# Patient Record
Sex: Male | Born: 1950 | Race: White | Hispanic: No | Marital: Married | Smoking: Never smoker
Health system: Southern US, Community
[De-identification: ages and names within clinical notes are randomized; demographics above are authoritative.]

## PROBLEM LIST (undated history)

## (undated) DIAGNOSIS — I10 Essential (primary) hypertension: Secondary | ICD-10-CM

## (undated) DIAGNOSIS — N200 Calculus of kidney: Secondary | ICD-10-CM

## (undated) DIAGNOSIS — E119 Type 2 diabetes mellitus without complications: Secondary | ICD-10-CM

## (undated) DIAGNOSIS — M1990 Unspecified osteoarthritis, unspecified site: Secondary | ICD-10-CM

## (undated) DIAGNOSIS — K219 Gastro-esophageal reflux disease without esophagitis: Secondary | ICD-10-CM

## (undated) HISTORY — PX: COLONOSCOPY: SHX174

## (undated) HISTORY — PX: JOINT REPLACEMENT: SHX530

## (undated) HISTORY — PX: ROTATOR CUFF REPAIR: SHX139

---

## 2015-02-27 ENCOUNTER — Other Ambulatory Visit: Payer: Self-pay | Admitting: Orthopedic Surgery

## 2015-02-27 DIAGNOSIS — M533 Sacrococcygeal disorders, not elsewhere classified: Secondary | ICD-10-CM

## 2015-02-27 DIAGNOSIS — M48061 Spinal stenosis, lumbar region without neurogenic claudication: Secondary | ICD-10-CM

## 2015-03-08 ENCOUNTER — Ambulatory Visit
Admission: RE | Admit: 2015-03-08 | Discharge: 2015-03-08 | Disposition: A | Discharge status: Home / Self Care | Payer: Worker's Compensation | Source: Ambulatory Visit | Attending: Orthopedic Surgery | Admitting: Orthopedic Surgery

## 2015-03-08 DIAGNOSIS — M533 Sacrococcygeal disorders, not elsewhere classified: Secondary | ICD-10-CM

## 2015-03-08 DIAGNOSIS — M48061 Spinal stenosis, lumbar region without neurogenic claudication: Secondary | ICD-10-CM

## 2015-08-09 ENCOUNTER — Other Ambulatory Visit: Payer: Self-pay | Admitting: Orthopedic Surgery

## 2015-08-21 ENCOUNTER — Other Ambulatory Visit (HOSPITAL_COMMUNITY): Payer: Self-pay | Admitting: *Deleted

## 2015-08-21 NOTE — Pre-Procedure Instructions (Signed)
Robert Golden  08/21/2015      [id number] [place]  - [address] [place] [address] Phone: [phone] Fax: [phone]    Your procedure is scheduled on Thursday, August 29, 2015 at 7:30 AM.  Report to [facility] Entrance "A" Admitting Office at 5:30 AM.  Call this number if you have problems the morning of surgery: [phone]  Any questions prior to day of surgery, please call [phone] between 8 & 4 PM.   Remember:  Do not eat food or drink liquids after midnight Wednesday, 08/28/15.  Take these medicines the morning of surgery with A SIP OF WATER: Hydrocodone - if needed, eye drops  Stop Aspirin as of today.    How to Manage Your Diabetes Before Surgery   Why is it important to control my blood sugar before and after surgery?   Improving blood sugar levels before and after surgery helps healing and can limit problems.  A way of improving blood sugar control is eating a healthy diet by:  - Eating less sugar and carbohydrates  - Increasing activity/exercise  - Talk with your doctor about reaching your blood sugar goals  High blood sugars (greater than 180 mg/dL) can raise your risk of infections and slow down your recovery so you will need to focus on controlling your diabetes during the weeks before surgery.  Make sure that the doctor who takes care of your diabetes knows about your planned surgery including the date and location.  How do I manage my blood sugars before surgery?   Check your blood sugar at least 4 times a day, 2 days before surgery to make sure that they are not too high or low.  Check your blood sugar the morning of your surgery when you wake up and every 2 hours until you get to the Short-Stay unit.  Treat a low blood sugar (less than 70 mg/dL) with 1/2 cup of clear juice (cranberry or apple), 4 glucose tablets, OR glucose gel.  Recheck blood sugar in 15 minutes after  treatment (to make sure it is greater than 70 mg/dL).  If blood sugar is not greater than 70 mg/dL on re-check, call [phone] for further instructions.   Report your blood sugar to the Short-Stay nurse when you get to Short-Stay.  References:  [facility], 2007 "How to Manage your Diabetes Before and After Surgery".  What do I do about my diabetes medications?   Do not take oral diabetes medicines (pills) the morning of surgery.   Do not wear jewelry.  Do not wear lotions, powders, or cologne.  You may wear deodorant.  Men may shave face and neck.  Do not bring valuables to the hospital.  [facility] is not responsible for any belongings or valuables.  Contacts, dentures or bridgework may not be worn into surgery.  Leave your suitcase in the car.  After surgery it may be brought to your room.  For patients admitted to the hospital, discharge time will be determined by your treatment team.  Special instructions:  [facility] - Preparing for Surgery  Before surgery, you can play an important role.  Because skin is not sterile, your skin needs to be as free of germs as possible.  You can reduce the number of germs on you skin by washing with CHG (chlorahexidine gluconate) soap before surgery.  CHG is an antiseptic cleaner which kills  germs and bonds with the skin to continue killing germs even after washing.  Please DO NOT use if you have an allergy to CHG or antibacterial soaps.  If your skin becomes reddened/irritated stop using the CHG and inform your nurse when you arrive at Short Stay.  Do not shave (including legs and underarms) for at least 48 hours prior to the first CHG shower.  You may shave your face.  Please follow these instructions carefully:   1.  Shower with CHG Soap the night before surgery and the                                morning of Surgery.  2.  If you choose to wash your hair, wash your hair first as usual with your       normal  shampoo.  3.  After you shampoo, rinse your hair and body thoroughly to remove the                      Shampoo.  4.  Use CHG as you would any other liquid soap.  You can apply chg directly       to the skin and wash gently with scrungie or a clean washcloth.  5.  Apply the CHG Soap to your body ONLY FROM THE NECK DOWN.        Do not use on open wounds or open sores.  Avoid contact with your eyes, ears, mouth and genitals (private parts).  Wash genitals (private parts) with your normal soap.  6.  Wash thoroughly, paying special attention to the area where your surgery        will be performed.  7.  Thoroughly rinse your body with warm water from the neck down.  8.  DO NOT shower/wash with your normal soap after using and rinsing off       the CHG Soap.  9.  Pat yourself dry with a clean towel.            10.  Wear clean pajamas.            11.  Place clean sheets on your bed the night of your first shower and do not        sleep with pets.  Day of Surgery  Do not apply any lotions the morning of surgery.  Please wear clean clothes to the hospital.   Please read over the following fact sheets that you were given. Pain Booklet, Coughing and Deep Breathing, Blood Transfusion Information, MRSA Information and Surgical Site Infection Prevention

## 2015-08-22 ENCOUNTER — Encounter (HOSPITAL_COMMUNITY)
Admission: RE | Admit: 2015-08-22 | Discharge: 2015-08-22 | Disposition: A | Discharge status: Home / Self Care | Payer: Worker's Compensation | Source: Ambulatory Visit | Attending: Orthopedic Surgery | Admitting: Orthopedic Surgery

## 2015-08-22 ENCOUNTER — Encounter (HOSPITAL_COMMUNITY): Payer: Self-pay

## 2015-08-22 DIAGNOSIS — Z01812 Encounter for preprocedural laboratory examination: Secondary | ICD-10-CM | POA: Insufficient documentation

## 2015-08-22 DIAGNOSIS — M79604 Pain in right leg: Secondary | ICD-10-CM | POA: Diagnosis not present

## 2015-08-22 DIAGNOSIS — Z01818 Encounter for other preprocedural examination: Secondary | ICD-10-CM | POA: Insufficient documentation

## 2015-08-22 HISTORY — DX: Gastro-esophageal reflux disease without esophagitis: K21.9

## 2015-08-22 HISTORY — DX: Type 2 diabetes mellitus without complications: E11.9

## 2015-08-22 HISTORY — DX: Calculus of kidney: N20.0

## 2015-08-22 HISTORY — DX: Essential (primary) hypertension: I10

## 2015-08-22 HISTORY — DX: Unspecified osteoarthritis, unspecified site: M19.90

## 2015-08-22 LAB — COMPREHENSIVE METABOLIC PANEL
ALT: 25 U/L (ref 17–63)
AST: 34 U/L (ref 15–41)
Albumin: 3.7 g/dL (ref 3.5–5.0)
Alkaline Phosphatase: 38 U/L (ref 38–126)
Anion gap: 12 (ref 5–15)
BILIRUBIN TOTAL: 0.5 mg/dL (ref 0.3–1.2)
BUN: 16 mg/dL (ref 6–20)
CALCIUM: 9.8 mg/dL (ref 8.9–10.3)
CO2: 24 mmol/L (ref 22–32)
CREATININE: 1.04 mg/dL (ref 0.61–1.24)
Chloride: 105 mmol/L (ref 101–111)
GFR calc Af Amer: >60 mL/min (ref >60)
Glucose, Bld: 140 mg/dL — ABNORMAL HIGH (ref 65–99)
Potassium: 4 mmol/L (ref 3.5–5.1)
Sodium: 141 mmol/L (ref 135–145)
TOTAL PROTEIN: 6.8 g/dL (ref 6.5–8.1)

## 2015-08-22 LAB — URINE MICROSCOPIC-ADD ON
Bacteria, UA: NONE SEEN
RBC / HPF: NONE SEEN RBC/hpf (ref 0–5)
WBC UA: NONE SEEN WBC/hpf (ref 0–5)

## 2015-08-22 LAB — CBC WITH DIFFERENTIAL/PLATELET
BASOS ABS: 0 10*3/uL (ref 0.0–0.1)
Basophils Relative: 0 %
Eosinophils Absolute: 0.2 10*3/uL (ref 0.0–0.7)
Eosinophils Relative: 4 %
HEMATOCRIT: 35.2 % — AB (ref 39.0–52.0)
Hemoglobin: 11.1 g/dL — ABNORMAL LOW (ref 13.0–17.0)
LYMPHS ABS: 1.7 10*3/uL (ref 0.7–4.0)
LYMPHS PCT: 36 %
MCH: 23.4 pg — AB (ref 26.0–34.0)
MCHC: 31.5 g/dL (ref 30.0–36.0)
MCV: 74.1 fL — AB (ref 78.0–100.0)
MONO ABS: 0.5 10*3/uL (ref 0.1–1.0)
Monocytes Relative: 11 %
NEUTROS ABS: 2.3 10*3/uL (ref 1.7–7.7)
Neutrophils Relative %: 49 %
Platelets: 235 10*3/uL (ref 150–400)
RBC: 4.75 MIL/uL (ref 4.22–5.81)
RDW: 16.2 % — ABNORMAL HIGH (ref 11.5–15.5)
WBC: 4.7 10*3/uL (ref 4.0–10.5)

## 2015-08-22 LAB — TYPE AND SCREEN
ABO/RH(D): A POS
Antibody Screen: NEGATIVE

## 2015-08-22 LAB — URINALYSIS, ROUTINE W REFLEX MICROSCOPIC
BILIRUBIN URINE: NEGATIVE
HGB URINE DIPSTICK: NEGATIVE
KETONES UR: NEGATIVE mg/dL
Leukocytes, UA: NEGATIVE
Nitrite: NEGATIVE
PH: 6 (ref 5.0–8.0)
Protein, ur: NEGATIVE mg/dL
Specific Gravity, Urine: 1.036 — ABNORMAL HIGH (ref 1.005–1.030)

## 2015-08-22 LAB — ABO/RH: ABO/RH(D): A POS

## 2015-08-22 LAB — PROTIME-INR
INR: 1.16 (ref 0.00–1.49)
PROTHROMBIN TIME: 15 s (ref 11.6–15.2)

## 2015-08-22 LAB — SURGICAL PCR SCREEN
MRSA, PCR: NEGATIVE
Staphylococcus aureus: POSITIVE — AB

## 2015-08-22 LAB — APTT: APTT: 29 s (ref 24–37)

## 2015-08-22 LAB — GLUCOSE, CAPILLARY: Glucose-Capillary: 142 mg/dL — ABNORMAL HIGH (ref 65–99)

## 2015-08-22 NOTE — Progress Notes (Signed)
PCP is Dr. Gracy Bruinsadiotchenko Denies ever seeing a cardiologist. Denies ever having a card cath, Stress test, or echo. Reports his fasting cbg's run 120-140's

## 2015-08-22 NOTE — Progress Notes (Signed)
[facility] [place]. Mr Robert Golden was called and informed to pick up the script.

## 2015-08-23 LAB — HEMOGLOBIN A1C
HEMOGLOBIN A1C: 7.6 % — AB (ref 4.8–5.6)
Mean Plasma Glucose: 171 mg/dL

## 2015-08-27 NOTE — H&P (Signed)
PREOPERATIVE H&P  Chief Complaint: R leg pain  HPI: Robert Golden E Nault is a 65 y.o. male who presents with ongoing pain in the right leg  MRI reveals moderate to severe spinal stenosis at L4-5.  The patient's radiographs do reveal a grade 1 L4-5 spondylolisthesis.  Patient has failed multiple forms of conservative care and continues to have pain (see office notes for additional details regarding the patient's full course of treatment)  Past Medical History  Diagnosis Date  . Hypertension   . Diabetes mellitus without complication (HCC)   . Kidney stones   . GERD (gastroesophageal reflux disease)   . Arthritis    Past Surgical History  Procedure Laterality Date  . Rotator cuff repair Left   . Joint replacement      both kness 2008  . Colonoscopy     Social History   Social History  . Marital Status: Married    Spouse Name: N/A  . Number of Children: N/A  . Years of Education: N/A   Social History Main Topics  . Smoking status: Never Smoker   . Smokeless tobacco: Not on file  . Alcohol Use: No  . Drug Use: No  . Sexual Activity: Not on file   Other Topics Concern  . Not on file   Social History Narrative  . No narrative on file   No family history on file. Allergies  Allergen Reactions  . Avelox [Moxifloxacin] Rash  . Januvia [Sitagliptin] Other (See Comments)    Did not sit well on stomach    Prior to Admission medications   Medication Sig Start Date End Date Taking? Authorizing Provider  aspirin 81 MG tablet Take 81 mg by mouth daily.   Yes Historical Provider, MD  brimonidine (ALPHAGAN) 0.15 % ophthalmic solution Place 1 drop into the right eye 2 (two) times daily. 08/10/15  Yes Historical Provider, MD  cyclobenzaprine (FLEXERIL) 10 MG tablet Take 10 mg by mouth 3 (three) times daily as needed for muscle spasms.   Yes Historical Provider, MD  dorzolamide-timolol (COSOPT) 22.3-6.8 MG/ML ophthalmic solution Place 1 drop into the right eye 2 (two) times  daily. 08/06/15  Yes Historical Provider, MD  HYDROcodone-acetaminophen (NORCO/VICODIN) 5-325 MG tablet Take 1 tablet by mouth every 6 (six) hours as needed for moderate pain.   Yes Historical Provider, MD  INVOKAMET [id number] MG TABS Take 1 tablet by mouth 2 (two) times daily. 05/28/15  Yes Historical Provider, MD  lisinopril-hydrochlorothiazide (PRINZIDE,ZESTORETIC) 20-25 MG tablet Take 1 tablet by mouth daily. 05/27/15  Yes Historical Provider, MD  omeprazole (PRILOSEC) 20 MG capsule Take 20 mg by mouth daily.   Yes Historical Provider, MD  pioglitazone (ACTOS) 15 MG tablet Take 15 mg by mouth daily.   Yes Historical Provider, MD  tadalafil (CIALIS) 5 MG tablet Take 5 mg by mouth daily as needed for erectile dysfunction.   Yes Historical Provider, MD     All other systems have been reviewed and were otherwise negative with the exception of those mentioned in the HPI and as above.  Physical Exam: There were no vitals filed for this visit.  General: Alert, no acute distress Cardiovascular: No pedal edema Respiratory: No cyanosis, no use of accessory musculature Skin: No lesions in the area of chief complaint Neurologic: Sensation intact distally Psychiatric: Patient is competent for consent with normal mood and affect Lymphatic: No axillary or cervical lymphadenopathy  MUSCULOSKELETAL: positive straight leg raise on the right  Assessment/Plan: Right leg pain  Plan for Procedure(s): RIGHT SIDED LUMBAR 4-5 TRANSFORAMINAL LUMBAR INTERBODY FUSION WITH INSTRUMENTATION AND ALLOGRAFT   Emilee Hero, MD 08/27/2015 3:19 PM

## 2015-08-28 MED ORDER — CEFAZOLIN SODIUM-DEXTROSE 2-4 GM/100ML-% IV SOLN
2.0000 g | INTRAVENOUS | Status: AC
  Administered 2015-08-29: 2 g via INTRAVENOUS
  Filled 2015-08-28: qty 100

## 2015-08-29 ENCOUNTER — Encounter (HOSPITAL_COMMUNITY)
Admission: RE | Disposition: A | Discharge status: Home / Self Care | Payer: Self-pay | Source: Ambulatory Visit | Attending: Orthopedic Surgery

## 2015-08-29 ENCOUNTER — Inpatient Hospital Stay (HOSPITAL_COMMUNITY)
Admission: RE | Admit: 2015-08-29 | Discharge: 2015-08-30 | DRG: 460 | Disposition: A | Discharge status: Home / Self Care | Payer: Worker's Compensation | Source: Ambulatory Visit | Attending: Orthopedic Surgery | Admitting: Orthopedic Surgery

## 2015-08-29 ENCOUNTER — Inpatient Hospital Stay (HOSPITAL_COMMUNITY): Payer: Worker's Compensation | Admitting: Anesthesiology

## 2015-08-29 ENCOUNTER — Inpatient Hospital Stay (HOSPITAL_COMMUNITY): Payer: Worker's Compensation

## 2015-08-29 ENCOUNTER — Encounter (HOSPITAL_COMMUNITY): Payer: Self-pay | Admitting: *Deleted

## 2015-08-29 DIAGNOSIS — M4806 Spinal stenosis, lumbar region: Secondary | ICD-10-CM | POA: Diagnosis present

## 2015-08-29 DIAGNOSIS — Z881 Allergy status to other antibiotic agents status: Secondary | ICD-10-CM | POA: Diagnosis not present

## 2015-08-29 DIAGNOSIS — Z888 Allergy status to other drugs, medicaments and biological substances status: Secondary | ICD-10-CM | POA: Diagnosis not present

## 2015-08-29 DIAGNOSIS — M4800 Spinal stenosis, site unspecified: Secondary | ICD-10-CM | POA: Diagnosis present

## 2015-08-29 DIAGNOSIS — Z79899 Other long term (current) drug therapy: Secondary | ICD-10-CM | POA: Diagnosis not present

## 2015-08-29 DIAGNOSIS — E119 Type 2 diabetes mellitus without complications: Secondary | ICD-10-CM | POA: Diagnosis present

## 2015-08-29 DIAGNOSIS — Z419 Encounter for procedure for purposes other than remedying health state, unspecified: Secondary | ICD-10-CM

## 2015-08-29 DIAGNOSIS — I10 Essential (primary) hypertension: Secondary | ICD-10-CM | POA: Diagnosis present

## 2015-08-29 DIAGNOSIS — Z7982 Long term (current) use of aspirin: Secondary | ICD-10-CM | POA: Diagnosis not present

## 2015-08-29 DIAGNOSIS — Z96653 Presence of artificial knee joint, bilateral: Secondary | ICD-10-CM | POA: Diagnosis present

## 2015-08-29 DIAGNOSIS — K219 Gastro-esophageal reflux disease without esophagitis: Secondary | ICD-10-CM | POA: Diagnosis present

## 2015-08-29 DIAGNOSIS — M79604 Pain in right leg: Secondary | ICD-10-CM | POA: Diagnosis present

## 2015-08-29 LAB — GLUCOSE, CAPILLARY
GLUCOSE-CAPILLARY: 130 mg/dL — AB (ref 65–99)
GLUCOSE-CAPILLARY: 101 mg/dL — AB (ref 65–99)
Glucose-Capillary: 156 mg/dL — ABNORMAL HIGH (ref 65–99)
Glucose-Capillary: 192 mg/dL — ABNORMAL HIGH (ref 65–99)

## 2015-08-29 SURGERY — POSTERIOR LUMBAR FUSION 1 LEVEL
Anesthesia: General | Site: Back | Laterality: Right

## 2015-08-29 MED ORDER — MENTHOL 3 MG MT LOZG: 1.0000 | LOZENGE | OROMUCOSAL | Status: DC | PRN

## 2015-08-29 MED ORDER — ZOLPIDEM TARTRATE 5 MG PO TABS: 5.0000 mg | ORAL_TABLET | Freq: Every evening | ORAL | Status: DC | PRN

## 2015-08-29 MED ORDER — POVIDONE-IODINE 7.5 % EX SOLN
Freq: Once | CUTANEOUS | Status: DC
  Filled 2015-08-29: qty 118

## 2015-08-29 MED ORDER — CANAGLIFLOZIN-METFORMIN HCL 150-1000 MG PO TABS: 1.0000 | ORAL_TABLET | Freq: Two times a day (BID) | ORAL | Status: DC

## 2015-08-29 MED ORDER — PANTOPRAZOLE SODIUM 40 MG PO TBEC
40.0000 mg | DELAYED_RELEASE_TABLET | Freq: Every day | ORAL | Status: DC
  Administered 2015-08-30: 40 mg via ORAL
  Filled 2015-08-29: qty 1

## 2015-08-29 MED ORDER — ACETAMINOPHEN 650 MG RE SUPP: 650.0000 mg | RECTAL | Status: DC | PRN

## 2015-08-29 MED ORDER — THROMBIN 20000 UNITS EX KIT: PACK | CUTANEOUS | Status: DC | PRN

## 2015-08-29 MED ORDER — OXYCODONE HCL 5 MG PO TABS
ORAL_TABLET | ORAL | Status: AC
  Filled 2015-08-29: qty 1

## 2015-08-29 MED ORDER — ROCURONIUM BROMIDE 50 MG/5ML IV SOLN
INTRAVENOUS | Status: AC
  Filled 2015-08-29: qty 1

## 2015-08-29 MED ORDER — PHENYLEPHRINE 40 MCG/ML (10ML) SYRINGE FOR IV PUSH (FOR BLOOD PRESSURE SUPPORT)
PREFILLED_SYRINGE | INTRAVENOUS | Status: AC
  Filled 2015-08-29: qty 10

## 2015-08-29 MED ORDER — MIDAZOLAM HCL 5 MG/5ML IJ SOLN
INTRAMUSCULAR | Status: DC | PRN
  Administered 2015-08-29 (×2): 1 mg via INTRAVENOUS

## 2015-08-29 MED ORDER — HYDROMORPHONE HCL 1 MG/ML IJ SOLN
INTRAMUSCULAR | Status: AC
  Filled 2015-08-29: qty 1

## 2015-08-29 MED ORDER — METHYLENE BLUE 0.5 % INJ SOLN
INTRAVENOUS | Status: AC
  Filled 2015-08-29: qty 10

## 2015-08-29 MED ORDER — OXYCODONE-ACETAMINOPHEN 5-325 MG PO TABS
1.0000 | ORAL_TABLET | ORAL | Status: DC | PRN
  Administered 2015-08-29 – 2015-08-30 (×3): 2 via ORAL
  Administered 2015-08-30: 1 via ORAL
  Filled 2015-08-29 (×4): qty 2
  Filled 2015-08-29: qty 1

## 2015-08-29 MED ORDER — LIDOCAINE HCL (CARDIAC) 20 MG/ML IV SOLN
INTRAVENOUS | Status: AC
Start: 2015-08-29 — End: 2015-08-29
  Filled 2015-08-29: qty 5

## 2015-08-29 MED ORDER — ACETAMINOPHEN 325 MG PO TABS: 650.0000 mg | ORAL_TABLET | ORAL | Status: DC | PRN

## 2015-08-29 MED ORDER — OXYCODONE HCL 5 MG/5ML PO SOLN: 5.0000 mg | Freq: Once | ORAL | Status: AC | PRN

## 2015-08-29 MED ORDER — PROPOFOL 1000 MG/100ML IV EMUL
INTRAVENOUS | Status: AC
  Filled 2015-08-29: qty 300

## 2015-08-29 MED ORDER — DORZOLAMIDE HCL-TIMOLOL MAL 2-0.5 % OP SOLN
1.0000 [drp] | Freq: Two times a day (BID) | OPHTHALMIC | Status: DC
  Filled 2015-08-29: qty 10

## 2015-08-29 MED ORDER — LACTATED RINGERS IV SOLN
INTRAVENOUS | Status: DC | PRN
  Administered 2015-08-29 (×3): via INTRAVENOUS

## 2015-08-29 MED ORDER — ALUM & MAG HYDROXIDE-SIMETH 200-200-20 MG/5ML PO SUSP: 30.0000 mL | Freq: Four times a day (QID) | ORAL | Status: DC | PRN

## 2015-08-29 MED ORDER — METHYLENE BLUE 0.5 % INJ SOLN
INTRAVENOUS | Status: DC | PRN
  Administered 2015-08-29: 1 mL

## 2015-08-29 MED ORDER — BRIMONIDINE TARTRATE 0.15 % OP SOLN
1.0000 [drp] | Freq: Two times a day (BID) | OPHTHALMIC | Status: DC
  Filled 2015-08-29: qty 5

## 2015-08-29 MED ORDER — CEFAZOLIN SODIUM 1-5 GM-% IV SOLN
1.0000 g | Freq: Three times a day (TID) | INTRAVENOUS | Status: AC
  Administered 2015-08-29 (×2): 1 g via INTRAVENOUS
  Filled 2015-08-29 (×2): qty 50

## 2015-08-29 MED ORDER — NEOSTIGMINE METHYLSULFATE 10 MG/10ML IV SOLN
INTRAVENOUS | Status: AC
  Filled 2015-08-29: qty 1

## 2015-08-29 MED ORDER — BUPIVACAINE-EPINEPHRINE 0.25% -1:200000 IJ SOLN
INTRAMUSCULAR | Status: DC | PRN
  Administered 2015-08-29: 26 mL

## 2015-08-29 MED ORDER — DIAZEPAM 5 MG PO TABS
5.0000 mg | ORAL_TABLET | Freq: Four times a day (QID) | ORAL | Status: DC | PRN
  Administered 2015-08-29 – 2015-08-30 (×3): 5 mg via ORAL
  Filled 2015-08-29 (×4): qty 1

## 2015-08-29 MED ORDER — ONDANSETRON HCL 4 MG/2ML IJ SOLN: 4.0000 mg | Freq: Once | INTRAMUSCULAR | Status: DC | PRN

## 2015-08-29 MED ORDER — GLYCOPYRROLATE 0.2 MG/ML IJ SOLN
INTRAMUSCULAR | Status: DC | PRN
  Administered 2015-08-29: 0.6 mg via INTRAVENOUS

## 2015-08-29 MED ORDER — PROPOFOL 500 MG/50ML IV EMUL
INTRAVENOUS | Status: DC | PRN
  Administered 2015-08-29: 25 ug/kg/min via INTRAVENOUS

## 2015-08-29 MED ORDER — STERILE WATER FOR INJECTION IJ SOLN
INTRAMUSCULAR | Status: AC
  Filled 2015-08-29: qty 10

## 2015-08-29 MED ORDER — NEOSTIGMINE METHYLSULFATE 10 MG/10ML IV SOLN
INTRAVENOUS | Status: DC | PRN
  Administered 2015-08-29: 4 mg via INTRAVENOUS

## 2015-08-29 MED ORDER — ROCURONIUM BROMIDE 100 MG/10ML IV SOLN
INTRAVENOUS | Status: DC | PRN
  Administered 2015-08-29: 10 mg via INTRAVENOUS
  Administered 2015-08-29: 30 mg via INTRAVENOUS

## 2015-08-29 MED ORDER — SODIUM CHLORIDE 0.9 % IV SOLN
INTRAVENOUS | Status: DC
  Administered 2015-08-29: 75 mL/h via INTRAVENOUS

## 2015-08-29 MED ORDER — ONDANSETRON HCL 4 MG/2ML IJ SOLN: 4.0000 mg | INTRAMUSCULAR | Status: DC | PRN

## 2015-08-29 MED ORDER — PHENYLEPHRINE HCL 10 MG/ML IJ SOLN
INTRAMUSCULAR | Status: DC | PRN
  Administered 2015-08-29 (×2): 40 ug via INTRAVENOUS

## 2015-08-29 MED ORDER — OXYCODONE HCL 5 MG PO TABS
5.0000 mg | ORAL_TABLET | Freq: Once | ORAL | Status: AC | PRN
  Administered 2015-08-29: 5 mg via ORAL

## 2015-08-29 MED ORDER — PIOGLITAZONE HCL 15 MG PO TABS
15.0000 mg | ORAL_TABLET | Freq: Every day | ORAL | Status: DC
  Administered 2015-08-30: 15 mg via ORAL
  Filled 2015-08-29: qty 1

## 2015-08-29 MED ORDER — SURGIFOAM 100 EX MISC
CUTANEOUS | Status: DC | PRN
  Administered 2015-08-29: 09:00:00 via TOPICAL

## 2015-08-29 MED ORDER — SUCCINYLCHOLINE CHLORIDE 20 MG/ML IJ SOLN
INTRAMUSCULAR | Status: DC | PRN
  Administered 2015-08-29: 80 mg via INTRAVENOUS

## 2015-08-29 MED ORDER — MORPHINE SULFATE (PF) 2 MG/ML IV SOLN: 1.0000 mg | INTRAVENOUS | Status: DC | PRN

## 2015-08-29 MED ORDER — FENTANYL CITRATE (PF) 100 MCG/2ML IJ SOLN
INTRAMUSCULAR | Status: DC | PRN
Start: 2015-08-29 — End: 2015-08-29
  Administered 2015-08-29 (×4): 50 ug via INTRAVENOUS

## 2015-08-29 MED ORDER — METFORMIN HCL 500 MG PO TABS
1000.0000 mg | ORAL_TABLET | Freq: Two times a day (BID) | ORAL | Status: DC
  Administered 2015-08-29 – 2015-08-30 (×2): 1000 mg via ORAL
  Filled 2015-08-29 (×2): qty 2

## 2015-08-29 MED ORDER — SENNOSIDES-DOCUSATE SODIUM 8.6-50 MG PO TABS: 1.0000 | ORAL_TABLET | Freq: Every evening | ORAL | Status: DC | PRN

## 2015-08-29 MED ORDER — ONDANSETRON HCL 4 MG/2ML IJ SOLN
INTRAMUSCULAR | Status: DC | PRN
  Administered 2015-08-29: 4 mg via INTRAVENOUS

## 2015-08-29 MED ORDER — ONDANSETRON HCL 4 MG/2ML IJ SOLN
INTRAMUSCULAR | Status: AC
  Filled 2015-08-29: qty 2

## 2015-08-29 MED ORDER — DOCUSATE SODIUM 100 MG PO CAPS
100.0000 mg | ORAL_CAPSULE | Freq: Two times a day (BID) | ORAL | Status: DC
  Administered 2015-08-29 – 2015-08-30 (×2): 100 mg via ORAL
  Filled 2015-08-29 (×2): qty 1

## 2015-08-29 MED ORDER — BUPIVACAINE-EPINEPHRINE (PF) 0.25% -1:200000 IJ SOLN
INTRAMUSCULAR | Status: AC
  Filled 2015-08-29: qty 30

## 2015-08-29 MED ORDER — SUCCINYLCHOLINE CHLORIDE 20 MG/ML IJ SOLN
INTRAMUSCULAR | Status: AC
  Filled 2015-08-29: qty 1

## 2015-08-29 MED ORDER — PROPOFOL 10 MG/ML IV BOLUS
INTRAVENOUS | Status: AC
  Filled 2015-08-29: qty 40

## 2015-08-29 MED ORDER — HYDROCHLOROTHIAZIDE 25 MG PO TABS
25.0000 mg | ORAL_TABLET | Freq: Every day | ORAL | Status: DC
  Administered 2015-08-30: 25 mg via ORAL
  Filled 2015-08-29: qty 1

## 2015-08-29 MED ORDER — BISACODYL 5 MG PO TBEC: 5.0000 mg | DELAYED_RELEASE_TABLET | Freq: Every day | ORAL | Status: DC | PRN

## 2015-08-29 MED ORDER — LISINOPRIL-HYDROCHLOROTHIAZIDE 20-25 MG PO TABS: 1.0000 | ORAL_TABLET | Freq: Every day | ORAL | Status: DC

## 2015-08-29 MED ORDER — MIDAZOLAM HCL 2 MG/2ML IJ SOLN
INTRAMUSCULAR | Status: AC
  Filled 2015-08-29: qty 2

## 2015-08-29 MED ORDER — SODIUM CHLORIDE 0.9% FLUSH
3.0000 mL | Freq: Two times a day (BID) | INTRAVENOUS | Status: DC
  Administered 2015-08-29: 3 mL via INTRAVENOUS

## 2015-08-29 MED ORDER — FENTANYL CITRATE (PF) 250 MCG/5ML IJ SOLN
INTRAMUSCULAR | Status: AC
  Filled 2015-08-29: qty 5

## 2015-08-29 MED ORDER — PHENOL 1.4 % MT LIQD: 1.0000 | OROMUCOSAL | Status: DC | PRN

## 2015-08-29 MED ORDER — PROPOFOL 10 MG/ML IV BOLUS
INTRAVENOUS | Status: DC | PRN
  Administered 2015-08-29: 200 mg via INTRAVENOUS
  Administered 2015-08-29: 50 mg via INTRAVENOUS

## 2015-08-29 MED ORDER — SODIUM CHLORIDE 0.9% FLUSH: 3.0000 mL | INTRAVENOUS | Status: DC | PRN

## 2015-08-29 MED ORDER — THROMBIN 20000 UNITS EX SOLR
CUTANEOUS | Status: AC
  Filled 2015-08-29: qty 20000

## 2015-08-29 MED ORDER — HYDROMORPHONE HCL 1 MG/ML IJ SOLN
0.2500 mg | INTRAMUSCULAR | Status: DC | PRN
  Administered 2015-08-29: 0.5 mg via INTRAVENOUS

## 2015-08-29 MED ORDER — GLYCOPYRROLATE 0.2 MG/ML IJ SOLN
INTRAMUSCULAR | Status: AC
  Filled 2015-08-29: qty 4

## 2015-08-29 MED ORDER — LISINOPRIL 20 MG PO TABS
20.0000 mg | ORAL_TABLET | Freq: Every day | ORAL | Status: DC
  Administered 2015-08-30: 20 mg via ORAL
  Filled 2015-08-29: qty 1

## 2015-08-29 MED ORDER — ARTIFICIAL TEARS OP OINT
TOPICAL_OINTMENT | OPHTHALMIC | Status: AC
  Filled 2015-08-29: qty 3.5

## 2015-08-29 MED ORDER — FLEET ENEMA 7-19 GM/118ML RE ENEM: 1.0000 | ENEMA | Freq: Once | RECTAL | Status: DC | PRN

## 2015-08-29 MED ORDER — LIDOCAINE HCL (CARDIAC) 20 MG/ML IV SOLN
INTRAVENOUS | Status: DC | PRN
  Administered 2015-08-29: 100 mg via INTRAVENOUS

## 2015-08-29 MED ORDER — CANAGLIFLOZIN 100 MG PO TABS
100.0000 mg | ORAL_TABLET | Freq: Two times a day (BID) | ORAL | Status: DC
  Administered 2015-08-30: 100 mg via ORAL
  Filled 2015-08-29 (×2): qty 1

## 2015-08-29 MED ORDER — 0.9 % SODIUM CHLORIDE (POUR BTL) OPTIME
TOPICAL | Status: DC | PRN
  Administered 2015-08-29 (×3): 1000 mL

## 2015-08-29 MED FILL — Heparin Sodium (Porcine) Inj 1000 Unit/ML: INTRAMUSCULAR | Qty: 30 | Status: AC

## 2015-08-29 MED FILL — Sodium Chloride IV Soln 0.9%: INTRAVENOUS | Qty: 1000 | Status: AC

## 2015-08-29 SURGICAL SUPPLY — 87 items
BENZOIN TINCTURE PRP APPL 2/3 (GAUZE/BANDAGES/DRESSINGS) ×3 IMPLANT
BLADE SURG ROTATE 9660 (MISCELLANEOUS) ×3 IMPLANT
BUR PRESCISION 1.7 ELITE (BURR) IMPLANT
BUR ROUND PRECISION 4.0 (BURR) ×2 IMPLANT
BUR ROUND PRECISION 4.0MM (BURR) ×1
BUR SABER RD CUTTING 3.0 (BURR) ×2 IMPLANT
BUR SABER RD CUTTING 3.0MM (BURR) ×1
CAGE CONCORDE BULLET 11X12X27 (Cage) ×2 IMPLANT
CAGE SPNL PRLL BLT NOSE 27X11 (Cage) ×1 IMPLANT
CARTRIDGE OIL MAESTRO DRILL (MISCELLANEOUS) ×1 IMPLANT
CLOSURE STERI-STRIP 1/2X4 (GAUZE/BANDAGES/DRESSINGS) ×1
CLOSURE WOUND 1/2 X4 (GAUZE/BANDAGES/DRESSINGS) ×2
CLSR STERI-STRIP ANTIMIC 1/2X4 (GAUZE/BANDAGES/DRESSINGS) ×2 IMPLANT
CONT SPEC STER OR (MISCELLANEOUS) ×3 IMPLANT
COVER MAYO STAND STRL (DRAPES) ×6 IMPLANT
COVER SURGICAL LIGHT HANDLE (MISCELLANEOUS) ×3 IMPLANT
DECANTER SPIKE VIAL GLASS SM (MISCELLANEOUS) ×3 IMPLANT
DIFFUSER DRILL AIR PNEUMATIC (MISCELLANEOUS) ×3 IMPLANT
DRAIN CHANNEL 15F RND FF W/TCR (WOUND CARE) IMPLANT
DRAPE C-ARM 42X72 X-RAY (DRAPES) ×3 IMPLANT
DRAPE C-ARMOR (DRAPES) ×3 IMPLANT
DRAPE POUCH INSTRU U-SHP 10X18 (DRAPES) ×3 IMPLANT
DRAPE SURG 17X23 STRL (DRAPES) ×12 IMPLANT
DURAPREP 26ML APPLICATOR (WOUND CARE) ×3 IMPLANT
ELECT BLADE 4.0 EZ CLEAN MEGAD (MISCELLANEOUS) ×6
ELECT CAUTERY BLADE 6.4 (BLADE) ×3 IMPLANT
ELECT REM PT RETURN 9FT ADLT (ELECTROSURGICAL) ×3
ELECTRODE BLDE 4.0 EZ CLN MEGD (MISCELLANEOUS) ×2 IMPLANT
ELECTRODE REM PT RTRN 9FT ADLT (ELECTROSURGICAL) ×1 IMPLANT
EVACUATOR SILICONE 100CC (DRAIN) IMPLANT
FEE INTRAOP MONITOR IMPULS NCS (MISCELLANEOUS) ×1 IMPLANT
GAUZE SPONGE 4X4 12PLY STRL (GAUZE/BANDAGES/DRESSINGS) ×3 IMPLANT
GAUZE SPONGE 4X4 16PLY XRAY LF (GAUZE/BANDAGES/DRESSINGS) ×12 IMPLANT
GLOVE BIO SURGEON STRL SZ7 (GLOVE) ×3 IMPLANT
GLOVE BIO SURGEON STRL SZ8 (GLOVE) ×3 IMPLANT
GLOVE BIOGEL PI IND STRL 7.0 (GLOVE) ×1 IMPLANT
GLOVE BIOGEL PI IND STRL 8 (GLOVE) ×1 IMPLANT
GLOVE BIOGEL PI INDICATOR 7.0 (GLOVE) ×2
GLOVE BIOGEL PI INDICATOR 8 (GLOVE) ×2
GOWN STRL REUS W/ TWL LRG LVL3 (GOWN DISPOSABLE) ×4 IMPLANT
GOWN STRL REUS W/ TWL XL LVL3 (GOWN DISPOSABLE) ×1 IMPLANT
GOWN STRL REUS W/TWL LRG LVL3 (GOWN DISPOSABLE) ×8
GOWN STRL REUS W/TWL XL LVL3 (GOWN DISPOSABLE) ×2
INTRAOP MONITOR FEE IMPULS NCS (MISCELLANEOUS) ×1
INTRAOP MONITOR FEE IMPULSE (MISCELLANEOUS) ×2
IV CATH 14GX2 1/4 (CATHETERS) ×3 IMPLANT
KIT BASIN OR (CUSTOM PROCEDURE TRAY) ×3 IMPLANT
KIT ROOM TURNOVER OR (KITS) ×3 IMPLANT
MARKER SKIN DUAL TIP RULER LAB (MISCELLANEOUS) ×3 IMPLANT
MILL MEDIUM DISP (BLADE) ×3 IMPLANT
MIX DBX 10CC 35% BONE (Bone Implant) ×3 IMPLANT
NDL SAFETY ECLIPSE 18X1.5 (NEEDLE) ×1 IMPLANT
NEEDLE 22X1 1/2 (OR ONLY) (NEEDLE) ×3 IMPLANT
NEEDLE HYPO 18GX1.5 SHARP (NEEDLE) ×2
NEEDLE HYPO 25GX1X1/2 BEV (NEEDLE) ×3 IMPLANT
NEEDLE SPNL 18GX3.5 QUINCKE PK (NEEDLE) ×6 IMPLANT
NS IRRIG 1000ML POUR BTL (IV SOLUTION) ×3 IMPLANT
OIL CARTRIDGE MAESTRO DRILL (MISCELLANEOUS) ×3
PACK LAMINECTOMY ORTHO (CUSTOM PROCEDURE TRAY) ×3 IMPLANT
PACK UNIVERSAL I (CUSTOM PROCEDURE TRAY) ×3 IMPLANT
PAD ARMBOARD 7.5X6 YLW CONV (MISCELLANEOUS) ×6 IMPLANT
PATTIES SURGICAL .5 X1 (DISPOSABLE) ×3 IMPLANT
PATTIES SURGICAL .5X1.5 (GAUZE/BANDAGES/DRESSINGS) ×3 IMPLANT
PROBE PEDCLE PROBE MAGSTM DISP (MISCELLANEOUS) ×3 IMPLANT
ROD PRE BENT EXP 40MM (Rod) ×6 IMPLANT
SCREW SET SINGLE INNER (Screw) ×12 IMPLANT
SCREW VIPER CORT FIX 6.00X30 (Screw) ×3 IMPLANT
SCREW VIPER CORT FIX 6X35 (Screw) ×12 IMPLANT
SPONGE INTESTINAL PEANUT (DISPOSABLE) ×3 IMPLANT
SPONGE SURGIFOAM ABS GEL 100 (HEMOSTASIS) ×3 IMPLANT
STRIP CLOSURE SKIN 1/2X4 (GAUZE/BANDAGES/DRESSINGS) ×4 IMPLANT
SURGIFLO W/THROMBIN 8M KIT (HEMOSTASIS) IMPLANT
SUT MNCRL AB 4-0 PS2 18 (SUTURE) ×6 IMPLANT
SUT VIC AB 0 CT1 18XCR BRD 8 (SUTURE) ×1 IMPLANT
SUT VIC AB 0 CT1 8-18 (SUTURE) ×2
SUT VIC AB 1 CT1 18XCR BRD 8 (SUTURE) ×2 IMPLANT
SUT VIC AB 1 CT1 8-18 (SUTURE) ×4
SUT VIC AB 2-0 CT2 18 VCP726D (SUTURE) ×3 IMPLANT
SYR 20CC LL (SYRINGE) ×3 IMPLANT
SYR BULB IRRIGATION 50ML (SYRINGE) ×3 IMPLANT
SYR CONTROL 10ML LL (SYRINGE) ×6 IMPLANT
SYR TB 1ML LUER SLIP (SYRINGE) ×3 IMPLANT
TAPE CLOTH SURG 6X10 WHT LF (GAUZE/BANDAGES/DRESSINGS) ×3 IMPLANT
TOWEL OR 17X24 6PK STRL BLUE (TOWEL DISPOSABLE) ×3 IMPLANT
TOWEL OR 17X26 10 PK STRL BLUE (TOWEL DISPOSABLE) ×12 IMPLANT
TRAY FOLEY CATH 16FRSI W/METER (SET/KITS/TRAYS/PACK) ×3 IMPLANT
YANKAUER SUCT BULB TIP NO VENT (SUCTIONS) ×3 IMPLANT

## 2015-08-29 NOTE — Anesthesia Preprocedure Evaluation (Addendum)
Anesthesia Evaluation  Patient identified by MRN, date of birth, ID band Patient awake    Reviewed: Allergy & Precautions, H&P , NPO status , Patient's Chart, lab work & pertinent test results  History of Anesthesia Complications Negative for: history of anesthetic complications  Airway Mallampati: II  TM Distance: >3 FB Neck ROM: full    Dental no notable dental hx. (+) Teeth Intact, Dental Advisory Given   Pulmonary neg pulmonary ROS,    Pulmonary exam normal breath sounds clear to auscultation       Cardiovascular hypertension, Pt. on medications Normal cardiovascular exam Rhythm:regular Rate:Normal     Neuro/Psych negative neurological ROS     GI/Hepatic Neg liver ROS, GERD  Medicated and Controlled,  Endo/Other  diabetes, Well Controlled, Type 2  Renal/GU negative Renal ROS     Musculoskeletal   Abdominal   Peds  Hematology negative hematology ROS (+)   Anesthesia Other Findings   Reproductive/Obstetrics negative OB ROS                            Anesthesia Physical Anesthesia Plan  ASA: II  Anesthesia Plan: General   Post-op Pain Management:    Induction: Intravenous  Airway Management Planned: Oral ETT  Additional Equipment:   Intra-op Plan:   Post-operative Plan:   Informed Consent: I have reviewed the patients History and Physical, chart, labs and discussed the procedure including the risks, benefits and alternatives for the proposed anesthesia with the patient or authorized representative who has indicated his/her understanding and acceptance.   Dental Advisory Given  Plan Discussed with: Anesthesiologist, CRNA and Surgeon  Anesthesia Plan Comments:         Anesthesia Quick Evaluation

## 2015-08-29 NOTE — Anesthesia Procedure Notes (Signed)
Procedure Name: Intubation Date/Time: 08/29/2015 7:33 AM Performed by: Marni GriffonJAMES, [address] Pre-anesthesia Checklist: Patient identified, Emergency Drugs available, Suction available and Patient being monitored Patient Re-evaluated:Patient Re-evaluated prior to inductionOxygen Delivery Method: Circle system utilized Preoxygenation: Pre-oxygenation with 100% oxygen Intubation Type: IV induction Ventilation: Mask ventilation without difficulty Laryngoscope Size: Mac and 3 Grade View: Grade I Tube type: Oral Tube size: 7.5 mm Number of attempts: 1 Airway Equipment and Method: Stylet Placement Confirmation: ETT inserted through vocal cords under direct vision Secured at: 23 (cm at teeth) cm Tube secured with: Tape Dental Injury: Teeth and Oropharynx as per pre-operative assessment

## 2015-08-29 NOTE — Anesthesia Postprocedure Evaluation (Signed)
Anesthesia Post Note  Patient: Robert Golden  Procedure(s) Performed: Procedure(s) (LRB): RIGHT SIDED LUMBAR 4-5 TRANSFORAMINAL LUMBAR INTERBODY FUSION WITH INSTRUMENTATION AND ALLOGRAFT (Right)  Patient location during evaluation: PACU Anesthesia Type: General Level of consciousness: awake Pain management: pain level controlled Vital Signs Assessment: post-procedure vital signs reviewed and stable Respiratory status: spontaneous breathing Cardiovascular status: stable Anesthetic complications: no    Last Vitals:  Filed Vitals:   08/29/15 1215 08/29/15 1230  BP: 117/69 120/67  Pulse: 81 74  Temp:    Resp: 18 18    Last Pain:  Filed Vitals:   08/29/15 1234  PainSc: 4                  EDWARDS,Jacub Waiters

## 2015-08-29 NOTE — Transfer of Care (Signed)
Immediate Anesthesia Transfer of Care Note  Patient: Robert Golden  Procedure(s) Performed: Procedure(s): RIGHT SIDED LUMBAR 4-5 TRANSFORAMINAL LUMBAR INTERBODY FUSION WITH INSTRUMENTATION AND ALLOGRAFT (Right)  Patient Location: PACU  Anesthesia Type:General  Level of Consciousness: awake and patient cooperative  Airway & Oxygen Therapy: Patient Spontanous Breathing and Patient connected to nasal cannula oxygen  Post-op Assessment: Report given to RN, Post -op Vital signs reviewed and stable and Patient moving all extremities  Post vital signs: Reviewed and stable  Last Vitals:  Filed Vitals:   08/29/15 0559  BP: 125/62  Pulse: 80  Temp: 37 C  Resp: 20    Last Pain:  Filed Vitals:   08/29/15 0629  PainSc: 3          Complications: No apparent anesthesia complications

## 2015-08-30 LAB — GLUCOSE, CAPILLARY: Glucose-Capillary: 198 mg/dL — ABNORMAL HIGH (ref 65–99)

## 2015-08-30 NOTE — Evaluation (Signed)
Occupational Therapy Evaluation Patient Details Name: Robert Golden MRN: [id number] DOB: [birth year] Today's Date: 08/30/2015    History of Present Illness Pt is a 65 y.o. male s/p L4-5 TLIF. PMHx: HTN, DM, GERD, Arithritis, Kidney stones, Bil TKA, L Rotator cuff repair.    Clinical Impression   Pt reports he was independent with ADLs PTA. Currently pt is overall supervision for safety with ADLs and functional mobility. All back, safety, and ADL education completed with wife and pt; they have no further questions or concerns for OT at this time. Pt able to demo good understanding of back precautions during functional activities and was able to don back brace with set up sitting EOB. Pt planning to d/c home with 24/7 supervision from his wife. No further acute OT needs identified; signing off at this time. Please re-consult if needs change. Thank you for this referral.    Follow Up Recommendations  No OT follow up;Supervision - Intermittent    Equipment Recommendations  None recommended by OT    Recommendations for Other Services PT consult     Precautions / Restrictions Precautions Precautions: Fall;Back Precaution Booklet Issued:  (Already present in room) Precaution Comments: Educated pt and wife on back precautions. Required Braces or Orthoses: Spinal Brace Spinal Brace: Thoracolumbosacral orthotic;Applied in sitting position;Applied in standing position Restrictions Weight Bearing Restrictions: No      Mobility Bed Mobility               General bed mobility comments: Pt sitting EOB upon arrival.  Transfers Overall transfer level: Needs assistance Equipment used: None Transfers: Sit to/from Stand Sit to Stand: Supervision         General transfer comment: Supervision for safety; no physical assist required. VCs for hand placement and technique; pt able to return demo good hand placement/technique.    Balance Overall balance assessment: No apparent balance  deficits (not formally assessed)                                          ADL Overall ADL's : Needs assistance/impaired Eating/Feeding: Set up;Sitting   Grooming: Supervision/safety;Standing Grooming Details (indicate cue type and reason): Educated on use of 2 cups for oral care Upper Body Bathing: Supervision/ safety;Sitting   Lower Body Bathing: Supervison/ safety;Sit to/from stand   Upper Body Dressing : Set up;Supervision/safety;Sitting Upper Body Dressing Details (indicate cue type and reason): Pt able to don brace sitting EOB. Lower Body Dressing: Supervision/safety;Sit to/from stand Lower Body Dressing Details (indicate cue type and reason): Pt able to cross foot over opposite knee. Educated on compensatory strategies for LB ADLs; wife able to assist as needed. Toilet Transfer: Supervision/safety;Ambulation;BSC Toilet Transfer Details (indicate cue type and reason): Educated on use of 3 in 1 over toilet Toileting- Architect and Hygiene: Supervision/safety;Sit to/from stand Toileting - Clothing Manipulation Details (indicate cue type and reason): Discussed use of toilet aide if needed but pt able to demo reaching to bottom without twisting. Tub/ Shower Transfer: Supervision/safety;Walk-in shower;Ambulation;Shower Field seismologist Details (indicate cue type and reason): Educated on use of 3 in 1 in shower if needed. Functional mobility during ADLs: Supervision/safety General ADL Comments: Educated pt and wife on maintaining back precautions during functional activities, keeping frequently used items at counter top height, donning/doffing brace and wear schedule.     Vision     Perception     Praxis  Pertinent Vitals/Pain Pain Assessment: Faces Faces Pain Scale: Hurts little more Pain Location: back Pain Descriptors / Indicators: Discomfort;Sore Pain Intervention(s): Limited activity within patient's tolerance;Monitored during  session;Repositioned     Hand Dominance     Extremity/Trunk Assessment Upper Extremity Assessment Upper Extremity Assessment: Overall WFL for tasks assessed   Lower Extremity Assessment Lower Extremity Assessment: Defer to PT evaluation   Cervical / Trunk Assessment Cervical / Trunk Assessment: Other exceptions Cervical / Trunk Exceptions: s/p lumbar sx   Communication Communication Communication: No difficulties   Cognition Arousal/Alertness: Awake/alert Behavior During Therapy: WFL for tasks assessed/performed Overall Cognitive Status: Within Functional Limits for tasks assessed                     General Comments       Exercises       Shoulder Instructions      Home Living Family/patient expects to be discharged to:: Private residence Living Arrangements: Spouse/significant other Available Help at Discharge: Family;Available 24 hours/day Type of Home: House Home Access: Stairs to enter Entergy CorporationEntrance Stairs-Number of Steps: 2 Entrance Stairs-Rails: None Home Layout: One level     Bathroom Shower/Tub: Producer, television/film/videoWalk-in shower   Bathroom Toilet: Standard     Home Equipment: Environmental consultantWalker - 2 wheels;Bedside commode;Shower seat - built in          Prior Functioning/Environment Level of Independence: Independent             OT Diagnosis: Acute pain   OT Problem List:     OT Treatment/Interventions:      OT Goals(Current goals can be found in the care plan section) Acute Rehab OT Goals OT Goal Formulation: All assessment and education complete, DC therapy  OT Frequency:     Barriers to D/C:            Co-evaluation              End of Session Equipment Utilized During Treatment: Back brace  Activity Tolerance: Patient tolerated treatment well Patient left: Other (comment) (with PT)   Time: [id number] OT Time Calculation (min): 19 min Charges:  OT General Charges $OT Visit: 1 Procedure OT Evaluation $OT Eval Moderate Complexity: 1  Procedure G-Codes:     Gaye AlkenBailey A Eisha Chatterjee M.S., OTR/L Pager: [phone]  08/30/2015, 8:31 AM

## 2015-08-30 NOTE — Evaluation (Signed)
Physical Therapy Evaluation and Discharge Patient Details Name: Robert Golden MRN: [id number] DOB: 04-08-1951 Today's Date: 08/30/2015   History of Present Illness  Pt is a 65 y.o. male s/p L4-5 TLIF. PMHx: HTN, DM, GERD, Arithritis, Kidney stones, Bil TKA, L Rotator cuff repair.   Clinical Impression  Patient evaluated by Physical Therapy with no further acute PT needs identified. All education has been completed and the patient has no further questions. At the time of PT eval pt was able to perform transfers and ambulation with gross supervision for safety. Pt will have adequate assist at home and feel he is functioning at a level appropriate for d/c from a PT standpoint at this time. See below for any follow-up Physical Therapy or equipment needs. PT is signing off. Thank you for this referral.      Follow Up Recommendations Outpatient PT;Supervision for mobility/OOB    Equipment Recommendations  None recommended by PT    Recommendations for Other Services       Precautions / Restrictions Precautions Precautions: Fall;Back Precaution Booklet Issued:  (Already present in room) Precaution Comments: Pt was cued for precautions during functional mobility.  Required Braces or Orthoses: Spinal Brace Spinal Brace: Thoracolumbosacral orthotic;Applied in sitting position Restrictions Weight Bearing Restrictions: No      Mobility  Bed Mobility Overal bed mobility: Needs Assistance Bed Mobility: Rolling;Sidelying to Sit;Sit to Sidelying Rolling: Supervision Sidelying to sit: Supervision     Sit to sidelying: Min guard General bed mobility comments: VC's for sequencing and technique during bed mobility. Min guard required for LE elevation back into bed, however pt was able to transition to sitting with supervision.   Transfers Overall transfer level: Needs assistance Equipment used: None Transfers: Sit to/from Stand Sit to Stand: Supervision         General transfer comment:  Supervision for safety; no physical assist required. VCs for hand placement and technique; pt able to return demo good hand placement/technique.  Ambulation/Gait Ambulation/Gait assistance: Supervision Ambulation Distance (Feet): 400 Feet Assistive device: None Gait Pattern/deviations: Step-through pattern;Decreased stride length Gait velocity: Slightly decreased Gait velocity interpretation: Below normal speed for age/gender General Gait Details: Pt ambulated well in hall without an AD. VC's for general precautions and safety.   Stairs Stairs: Yes Stairs assistance: Supervision Stair Management: No rails;Alternating pattern;Forwards Number of Stairs: 10 General stair comments: VC's for general safety awareness.   Wheelchair Mobility    Modified Rankin (Stroke Patients Only)       Balance Overall balance assessment: No apparent balance deficits (not formally assessed)                                           Pertinent Vitals/Pain Pain Assessment: Faces Faces Pain Scale: Hurts little more Pain Location: Back Pain Descriptors / Indicators: Discomfort;Operative site guarding Pain Intervention(s): Limited activity within patient's tolerance;Monitored during session;Repositioned    Home Living Family/patient expects to be discharged to:: Private residence Living Arrangements: Spouse/significant other Available Help at Discharge: Family;Available 24 hours/day Type of Home: House Home Access: Stairs to enter Entrance Stairs-Rails: None Entrance Stairs-Number of Steps: 2 Home Layout: One level Home Equipment: Walker - 2 wheels;Bedside commode;Shower seat - built in      Prior Function Level of Independence: Independent               Higher education careers adviser  Extremity/Trunk Assessment   Upper Extremity Assessment: Defer to OT evaluation           Lower Extremity Assessment: Overall WFL for tasks assessed      Cervical / Trunk  Assessment: Other exceptions  Communication   Communication: No difficulties  Cognition Arousal/Alertness: Awake/alert Behavior During Therapy: WFL for tasks assessed/performed Overall Cognitive Status: Within Functional Limits for tasks assessed                      General Comments      Exercises        Assessment/Plan    PT Assessment Patent does not need any further PT services  PT Diagnosis Difficulty walking;Acute pain   PT Problem List    PT Treatment Interventions     PT Goals (Current goals can be found in the Care Plan section) Acute Rehab PT Goals Patient Stated Goal: Home today PT Goal Formulation: All assessment and education complete, DC therapy    Frequency     Barriers to discharge        Co-evaluation               End of Session Equipment Utilized During Treatment: Back brace Activity Tolerance: Patient tolerated treatment well Patient left: in chair;with call bell/phone within reach;with family/visitor present Nurse Communication: Mobility status         Time: 0810-0825 PT Time Calculation (min) (ACUTE ONLY): 15 min   Charges:   PT Evaluation $PT Eval Moderate Complexity: 1 Procedure     PT G CodesConni Slipper:        Shalana Jardin 08/30/2015, 9:23 AM   Conni SlipperLaura Drelyn Pistilli, PT, DPT Acute Rehabilitation Services Pager: [phone]

## 2015-08-30 NOTE — Op Note (Signed)
NAME:  Michaela CornerUCKER, Dontee                ACCOUNT NO.:  [id number]  MEDICAL RECORD NO.:  [id number]  LOCATION:  3C02C                        FACILITY:  [facility]  PHYSICIAN:  Estill BambergMark Dalana Pfahler, MD      DATE OF BIRTH:  [birth year]  DATE OF PROCEDURE:  08/29/2015                              OPERATIVE REPORT   PREOPERATIVE DIAGNOSES: 1. Right-sided lumbar radiculopathy. 2. Grade 1 L4-5 spondylolisthesis. 3. L4-5 spinal stenosis.  POSTOPERATIVE DIAGNOSES: 1. Right-sided lumbar radiculopathy. 2. Grade 1 L4-5 spondylolisthesis. 3. L4-5 spinal stenosis.  PROCEDURE: 1. L4-5 decompression with bilateral partial facetectomy and     decompression of the L4-5 intervertebral space. 2. Right-sided L4-5 transforaminal lumbar interbody fusion. 3. Left-sided L4-5 posterolateral fusion. 4. Placement of posterior instrumentation L4, L5. 5. Insertion of interbody device x1 (12 x 27 mm Concorde bullet cage). 6. Use of local autograft. 7. Use of morselized allograft-DBX mix. 8. Intraoperative use of fluoroscopy.  SURGEON:  Estill BambergMark Howard Patton, MD.  ASSISTANJason Coop:  Kayla McKenzie, PA-C.  ANESTHESIA:  General endotracheal anesthesia.  COMPLICATIONS:  None.  DISPOSITION:  Stable.  ESTIMATED BLOOD LOSS:  100 mL.  INDICATIONS FOR SURGERY:  Briefly, Mr. Pricilla Holmucker is a very pleasant 65 year old male, who did present to me status post a work injury that did occur on August 02, 2014.  Since his injury, he did have ongoing pain in the low back and into his right leg.  The patient's exam and history is very much consistent with both sacroiliac joint dysfunction as well as right- sided lumbar radiculopathy.  Again, the patient's MRI was very much consistent with moderate-to-severe spinal stenosis at L4-5 and the patient's radiographs did reveal instability at L4-5.  Given the patient's ongoing pain and dysfunction, we did discuss proceeding with the surgery reflected above.  The patient was fully aware of the risks and  limitations of surgery, and did elect to proceed.  OPERATIVE DETAILS:  On August 29, 2015, the patient was brought to surgery and general endotracheal anesthesia was administered.  The patient was placed prone on a well-padded flat Jackson bed with a Wilson frame.  Antibiotics were given.  The back was prepped and draped and a time-out was performed.  A midline incision was then made overlying the L4-5 intervertebral space.  The fascia was incised at the midline.  The lamina of L4 and L5 were identified and subperiosteally exposed.  There was abundant facet hypertrophy noted bilaterally across the L4-5 intervertebral space.  The facet hypertrophy was removed using a high- speed bur and a rongeur.  Using anatomic landmarks in addition to intraoperative fluoroscopy, I did cannulate the L4 and L5 pedicles using a high-speed bur followed by a series of taps.  I did tap up to 6 mm.  I did note excellent press-fit of each of the taps.  On the patient's left side, I did decorticate the left L4-5 facet joint and posterolateral gutter to help aid in the success of the fusion.  On the left, 6 x 35 mm screws were placed into the L4 and L5 pedicles using a cortical medial- to-lateral trajectory technique.  A 40 mm rod was placed and distraction was applied across the rod.  On the right side, the pedicles were cannulated, and bone wax was placed in that place.  I then proceeded with the decompressive aspect of the procedure.  The L4 spinous process was removed.  I did use a high-speed bur as well as a series of Kerrison punches to remove to liberally decompress the lateral recesses on the right and left sides, performing a partial facetectomy bilaterally.  In doing so, I was able to decompress the right and left lateral recesses. I then turned my attention towards the fusion portion of the procedure. With an assistant holding medial retraction of the traversing right L5 nerve, I did use a 15-blade  knife to perform an annulotomy.  I then used a series of curettes and pituitary rongeurs to perform a thorough and complete L4-5 intervertebral diskectomy.  I was very pleased with the diskectomy that I was able to accomplish.  After preparing the endplates, the intervertebral space was liberally packed with allograft and autograft.  The appropriate size interbody spacer was then also packed with allograft and autograft and tamped into position in the usual fashion.  I was very pleased with the press-fit of the intervertebral implant.  The distraction was then discontinued on the contralateral left side.  A Wilson frame was then flattened.  I then placed a 6 x 35 mm screw at L4 and a 6 x 30 mm screw on the right at L5. A 40 mm rod was then placed and caps were placed and provisionally tightened on the right and left sides.  I was very pleased with the final AP and lateral fluoroscopic images.  Of note, the wound was copiously irrigated throughout the procedure with a total of approximately 3 L of normal saline.  There was no abnormal bleeding encountered at the termination of the procedure.  I then liberally packed allograft and autograft into the lateral recess on the left side, again, to aid in the success of the fusion.  Of note, I did use neurologic monitoring throughout the surgery, and there was no abnormal EMG activity noted.  I did test the pedicle screws on the right, and there was no screw that tested below 15 milliamps.  I was very pleased with the final AP and lateral fluoroscopic images.  I then closed the wound in layers using #1 Vicryl followed by 2-0 Vicryl, followed by 3-0 Monocryl.  Benzoin and Steri-Strips were applied followed by sterile dressing.  All instrument counts were correct at the termination of the procedure.  Of note, Jason Coop was my assistant throughout surgery, and did aid in retraction, suctioning, and closure from start to finish.     Estill Bamberg, MD     MD/MEDQ  D:  08/29/2015  T:  08/30/2015  Job:  [id number]

## 2015-08-30 NOTE — Progress Notes (Signed)
    Patient doing well, resolved pre-op R leg pain, doing wonderful, he is very happy and has been up walking, mild well controlled LBP. Eating and drinking normal, NL B/B function.     Physical Exam: BP 111/52 mmHg  Pulse 107  Temp(Src) 99.3 F (37.4 C) (Oral)  Resp 18  Ht 5\' 7"  (1.702 m)  Wt 79.833 kg (176 lb)  BMI 27.56 kg/m2  SpO2 95%  Dressing in place, brace in place but not the appropriate brace, incision C/D/I NVI  POD #1 s/p L4-5 decompression and fusion   - up with PT/OT, encourage ambulation - Percocet for pain, Valium for muscle spasms - likely d/c home today after PT/OT - Working with W/C on getting the appropriate brace and bone stim

## 2015-08-30 NOTE — Progress Notes (Signed)
Patient alert and oriented, mae's well, voiding adequate amount of urine, swallowing without difficulty, c/o pain and medication given prior to discharged. Patient discharged home with family. Script and discharged instructions given to patient. Patient and family stated understanding of d/c instructions given and has an appointment with MD.  

## 2015-08-30 NOTE — Progress Notes (Signed)
     Of note, we did reach out to [employer] about the brace situation as he was provided the wrong brace by them. A biotech rep will be coming to the hospital to fit him with the appropriate brae. We did contact the floor and asked the patient be maintained until the biotech rep has arrived and fitted him for the appropriate brace. He can then proceed home as able. I did call and discuss this plan with Aisha.  They are on board with the plan.  Electronically Verified                                                       Jason CoopKayla Reynalda Canny, PA-C                                            Estill BambergMark Dumonski, MD

## 2015-09-24 NOTE — Discharge Summary (Signed)
Patient ID: Robert Golden MRN: [id number] DOB/AGE: 1951-04-07 65 y.o.  Admit date: 08/29/2015 Discharge date: 08/30/2015  Admission Diagnoses:  Active Problems:   Spinal stenosis   Discharge Diagnoses:  Same  Past Medical History  Diagnosis Date  . Hypertension   . Diabetes mellitus without complication (HCC)   . Kidney stones   . GERD (gastroesophageal reflux disease)   . Arthritis     Surgeries: Procedure(s): RIGHT SIDED LUMBAR 4-5 TRANSFORAMINAL LUMBAR INTERBODY FUSION WITH INSTRUMENTATION AND ALLOGRAFT on 08/29/2015   Consultants:  None  Discharged Condition: Improved  Hospital Course: Robert Golden is an 65 y.o. male who was admitted 08/29/2015 for operative treatment of radiculopathy. Patient has severe unremitting pain that affects sleep, daily activities, and work/hobbies. After pre-op clearance the patient was taken to the operating room on 08/29/2015 and underwent  Procedure(s): RIGHT SIDED LUMBAR 4-5 TRANSFORAMINAL LUMBAR INTERBODY FUSION WITH INSTRUMENTATION AND ALLOGRAFT.    Patient was given perioperative antibiotics:  Anti-infectives    Start     Dose/Rate Route Frequency Ordered Stop   08/29/15 1430  ceFAZolin (ANCEF) IVPB 1 g/50 mL premix     1 g 100 mL/hr over 30 Minutes Intravenous Every 8 hours 08/29/15 1425 08/29/15 2114   08/29/15 0700  ceFAZolin (ANCEF) IVPB 2g/100 mL premix     2 g 200 mL/hr over 30 Minutes Intravenous To ShortStay [facility] 08/28/15 0854 08/29/15 0747       Patient was given sequential compression devices, early ambulation to prevent DVT.  Patient benefited maximally from hospital stay and there were no complications.    Recent vital signs: BP 111/52 mmHg  Pulse 107  Temp(Src) 99.3 F (37.4 C) (Oral)  Resp 18  Ht  (1.702 m)  Wt 79.833 kg (176 lb)  BMI 27.56 kg/m2  SpO2 95%  Discharge Medications:     Medication List    TAKE these medications        aspirin 81 MG tablet  Take 81 mg by mouth daily.     brimonidine 0.15 % ophthalmic solution  Commonly known as:  ALPHAGAN  Place 1 drop into the right eye 2 (two) times daily.     dorzolamide-timolol 22.3-6.8 MG/ML ophthalmic solution  Commonly known as:  COSOPT  Place 1 drop into the right eye 2 (two) times daily.     INVOKAMET [id number] MG Tabs  Generic drug:  Canagliflozin-Metformin HCl  Take 1 tablet by mouth 2 (two) times daily.     lisinopril-hydrochlorothiazide 20-25 MG tablet  Commonly known as:  PRINZIDE,ZESTORETIC  Take 1 tablet by mouth daily.     omeprazole 20 MG capsule  Commonly known as:  PRILOSEC  Take 20 mg by mouth daily.     pioglitazone 15 MG tablet  Commonly known as:  ACTOS  Take 15 mg by mouth daily.     tadalafil 5 MG tablet  Commonly known as:  CIALIS  Take 5 mg by mouth daily as needed for erectile dysfunction.        Diagnostic Studies: Dg Lumbar Spine 2-3 Views  08/29/2015  CLINICAL DATA:  Right-sided TLIF at L4-5 EXAM: DG C-ARM 61-120 MIN; LUMBAR SPINE - 2-3 VIEW FLUOROSCOPY TIME:  1 minutes 0 seconds COMPARISON:  Lumbar spine intraoperative radiographs dated 08/29/2015 FINDINGS: Intraoperative fluoroscopic frontal and lateral radiographs during L4-5 TLIF. IMPRESSION: Intraoperative fluoroscopic images, as above. Electronically Signed   By: Charline Bills M.D.   On: 08/29/2015 11:34   Dg Lumbar Spine 1 View  08/29/2015  CLINICAL DATA:  Lumbar fusion.  Intraoperative exam. EXAM: LUMBAR SPINE - 1 VIEW COMPARISON:  No recent prior. FINDINGS: Lumbar vertebra numbered with the lowest lumbar appearing segmented vertebra as L5. Metallic markers noted posteriorly at the level of L3-L4 and L4-L5. 5 mm anterolisthesis L4 on L5. No acute bony abnormality . IMPRESSION: Metallic markers noted posteriorly at the level of L3-L4 and L4-L5. Electronically Signed   By: Maisie Fushomas  Register   On: 08/29/2015 10:28   Dg C-arm 61-120 Min  08/29/2015  CLINICAL DATA:  Right-sided TLIF at L4-5 EXAM: DG C-ARM 61-120 MIN; LUMBAR  SPINE - 2-3 VIEW FLUOROSCOPY TIME:  1 minutes 0 seconds COMPARISON:  Lumbar spine intraoperative radiographs dated 08/29/2015 FINDINGS: Intraoperative fluoroscopic frontal and lateral radiographs during L4-5 TLIF. IMPRESSION: Intraoperative fluoroscopic images, as above. Electronically Signed   By: Charline BillsSriyesh  Krishnan M.D.   On: 08/29/2015 11:34    Disposition: 01-Home or Self Care   POD #1 s/p L4-5 decompression and fusion   -Written scripts for pain signed and in chart -D/C instructions sheet printed and in chart -D/C today  -F/U in office 2 weeks   Signed: Georga BoraMCKENZIE, Tarvares Lant J 09/24/2015, 9:10 PM

## 2016-05-24 ENCOUNTER — Emergency Department (HOSPITAL_COMMUNITY)
Admission: EM | Admit: 2016-05-24 | Discharge: 2016-05-24 | Disposition: A | Discharge status: Home / Self Care | Payer: Worker's Compensation | Attending: Emergency Medicine | Admitting: Emergency Medicine

## 2016-05-24 ENCOUNTER — Emergency Department (HOSPITAL_COMMUNITY): Payer: Worker's Compensation

## 2016-05-24 ENCOUNTER — Encounter (HOSPITAL_COMMUNITY): Payer: Self-pay

## 2016-05-24 DIAGNOSIS — Y999 Unspecified external cause status: Secondary | ICD-10-CM | POA: Diagnosis not present

## 2016-05-24 DIAGNOSIS — M545 Low back pain, unspecified: Secondary | ICD-10-CM

## 2016-05-24 DIAGNOSIS — X501XXA Overexertion from prolonged static or awkward postures, initial encounter: Secondary | ICD-10-CM | POA: Insufficient documentation

## 2016-05-24 DIAGNOSIS — T148XXA Other injury of unspecified body region, initial encounter: Secondary | ICD-10-CM

## 2016-05-24 DIAGNOSIS — Z96653 Presence of artificial knee joint, bilateral: Secondary | ICD-10-CM | POA: Insufficient documentation

## 2016-05-24 DIAGNOSIS — Y939 Activity, unspecified: Secondary | ICD-10-CM | POA: Insufficient documentation

## 2016-05-24 DIAGNOSIS — Z7982 Long term (current) use of aspirin: Secondary | ICD-10-CM | POA: Diagnosis not present

## 2016-05-24 DIAGNOSIS — I10 Essential (primary) hypertension: Secondary | ICD-10-CM | POA: Diagnosis not present

## 2016-05-24 DIAGNOSIS — Y929 Unspecified place or not applicable: Secondary | ICD-10-CM | POA: Insufficient documentation

## 2016-05-24 DIAGNOSIS — S39012A Strain of muscle, fascia and tendon of lower back, initial encounter: Secondary | ICD-10-CM | POA: Diagnosis not present

## 2016-05-24 DIAGNOSIS — E119 Type 2 diabetes mellitus without complications: Secondary | ICD-10-CM | POA: Diagnosis not present

## 2016-05-24 DIAGNOSIS — Z79899 Other long term (current) drug therapy: Secondary | ICD-10-CM | POA: Insufficient documentation

## 2016-05-24 DIAGNOSIS — S3992XA Unspecified injury of lower back, initial encounter: Secondary | ICD-10-CM | POA: Diagnosis present

## 2016-05-24 MED ORDER — DICLOFENAC SODIUM 1 % TD GEL: 2.0000 g | Freq: Four times a day (QID) | TRANSDERMAL | 0 refills | Status: AC | PRN

## 2016-05-24 MED ORDER — CYCLOBENZAPRINE HCL 10 MG PO TABS: 10.0000 mg | ORAL_TABLET | Freq: Three times a day (TID) | ORAL | 0 refills | Status: AC | PRN

## 2016-05-24 NOTE — ED Triage Notes (Signed)
Pt. Has a hx of back surgery and yesterday  He was sitting on the floor and went to advoid something hitting him and he turned reached and he has had severe rt. Lumbar pain since.  Both morning you had difficulty standing on his legs, and last night he almost had a syncopal episode due to the pain.  Pt. Laid in the bed all day yesterday due to the pain.  Pt. Took  Flexeril and a Pain pill and it did help but he is worried about any damage.

## 2016-05-24 NOTE — ED Provider Notes (Signed)
[facility]-EMERGENCY DEPT Provider Note   CSN: [id number] Arrival date & time: 05/24/16  1230    History   Chief Complaint Chief Complaint  Patient presents with  . Back Pain    HPI Robert Golden is a 66 y.o. male.  HPI   Patient with h/o T2DM, HTN, GERD, spinal stenosis s/p L4-L5 fusion in 08/2015 who presents for R sided LBP.  He was fixing wiring in a cabinet 2 days ago when shelf fell.  When reaching to catch shelf, he felt pull in R side low back.  He tried to rest, use hot bath, and take Flexeril and Vicodin yesterday, which minimally helped.  His wife was concerned that he was having difficulty standing due to pain.  He reports once he is standing he is able to walk easily, but R low back hurts upon initial standing.  He denies fecal/urinary incontinence, radiation of pain, fever, abd pain, CP, SOB, N/V/D, weakness/numbness of extremities, saddle anesthesia  Past Medical History:  Diagnosis Date  . Arthritis   . Diabetes mellitus without complication (HCC)   . GERD (gastroesophageal reflux disease)   . Hypertension   . Kidney stones     Patient Active Problem List   Diagnosis Date Noted  . Spinal stenosis 08/29/2015    Past Surgical History:  Procedure Laterality Date  . COLONOSCOPY    . JOINT REPLACEMENT     both kness 2008  . ROTATOR CUFF REPAIR Left        Home Medications    Prior to Admission medications   Medication Sig Start Date End Date Taking? Authorizing Provider  aspirin EC 81 MG tablet Take 81 mg by mouth daily.   Yes Historical Provider, MD  brimonidine (ALPHAGAN) 0.15 % ophthalmic solution Place 1 drop into the right eye 2 (two) times daily. 08/10/15  Yes Historical Provider, MD  cyclobenzaprine (FLEXERIL) 10 MG tablet Take 10 mg by mouth daily as needed for muscle spasms.   Yes Historical Provider, MD  Dapagliflozin-Metformin HCl ER (XIGDUO XR) 09-998 MG TB24 Take 1 tablet by mouth 2 (two) times daily.   Yes Historical Provider, MD    dorzolamide-timolol (COSOPT) 22.3-6.8 MG/ML ophthalmic solution Place 1 drop into the right eye 2 (two) times daily. 08/06/15  Yes Historical Provider, MD  HYDROcodone-acetaminophen (NORCO/VICODIN) 5-325 MG tablet Take 1 tablet by mouth every 6 (six) hours as needed for moderate pain.   Yes Historical Provider, MD  latanoprost (XALATAN) 0.005 % ophthalmic solution Place 1 drop into the right eye at bedtime. 05/01/16  Yes Historical Provider, MD  lisinopril-hydrochlorothiazide (PRINZIDE,ZESTORETIC) 20-25 MG tablet Take 1 tablet by mouth daily. 05/27/15  Yes Historical Provider, MD  lovastatin (MEVACOR) 10 MG tablet Take 10 mg by mouth every other day.   Yes Historical Provider, MD  omeprazole (PRILOSEC) 20 MG capsule Take 20 mg by mouth daily.   Yes Historical Provider, MD  pioglitazone (ACTOS) 15 MG tablet Take 15 mg by mouth daily.   Yes Historical Provider, MD  tadalafil (CIALIS) 5 MG tablet Take 5 mg by mouth daily as needed for erectile dysfunction.   Yes Historical Provider, MD  cyclobenzaprine (FLEXERIL) 10 MG tablet Take 1 tablet (10 mg total) by mouth 3 (three) times daily as needed for muscle spasms. 05/24/16   Erasmo Downer, MD  diclofenac sodium (VOLTAREN) 1 % GEL Apply 2 g topically 4 (four) times daily as needed. 05/24/16   Erasmo Downer, MD    Family History History reviewed. No  pertinent family history.  Social History Social History  Substance Use Topics  . Smoking status: Never Smoker  . Smokeless tobacco: Never Used  . Alcohol use No     Allergies   Avelox [moxifloxacin] and Januvia [sitagliptin]   Review of Systems Review of Systems  Constitutional: Negative.   HENT: Negative.   Respiratory: Negative.   Cardiovascular: Negative.   Gastrointestinal: Negative.   Genitourinary: Negative.   Musculoskeletal: Positive for back pain. Negative for joint swelling, myalgias, neck pain and neck stiffness.  Skin: Negative.   Neurological: Negative.    Psychiatric/Behavioral: Negative.      Physical Exam Updated Vital Signs BP 110/72   Pulse 83   Temp 98.9 F (37.2 C) (Oral)   Resp 16   Ht 5\' 7"  (1.702 m)   Wt 76.7 kg   SpO2 99%   BMI 26.48 kg/m   Physical Exam  Constitutional: He is oriented to person, place, and time. He appears well-developed and well-nourished. No distress.  HENT:  Head: Normocephalic and atraumatic.  Right Ear: External ear normal.  Left Ear: External ear normal.  Mouth/Throat: Oropharynx is clear and moist.  Eyes: Conjunctivae are normal.  Neck: Neck supple.  Cardiovascular: Normal rate, regular rhythm and normal heart sounds.   No murmur heard. Pulmonary/Chest: Effort normal and breath sounds normal. No respiratory distress. He has no wheezes.  Abdominal: Soft. Bowel sounds are normal. He exhibits no distension and no mass. There is no tenderness. There is no rebound and no guarding.  Musculoskeletal: He exhibits no edema or deformity.  Ambulating and putting on gown independently when I enter room Able to sit and lay down in bed independently  Back: No midline tenderness over C/T/L spine, R paraspinal muscle tenderness Strength and sensation intact in lower extremities Negative SLR b/l  Lymphadenopathy:    He has no cervical adenopathy.  Neurological: He is alert and oriented to person, place, and time.  Skin: Skin is warm and dry. Capillary refill takes less than 2 seconds. No rash noted.  Psychiatric: He has a normal mood and affect. His behavior is normal. Thought content normal.  Nursing note and vitals reviewed.    ED Treatments / Results  Labs (all labs ordered are listed, but only abnormal results are displayed) Labs Reviewed - No data to display  EKG  EKG Interpretation None       Radiology Dg Lumbar Spine Complete  Result Date: 05/24/2016 CLINICAL DATA:  Sudden onset back pain while working in the yard. Prior back surgery April 2017. EXAM: LUMBAR SPINE - COMPLETE 4+  VIEW COMPARISON:  08/29/2015 FINDINGS: There are 5 nonrib bearing lumbar-type vertebral bodies. There is generalized osteopenia. The vertebral body heights are maintained. The alignment is anatomic. There is no static listhesis. There is no spondylolysis. There is no acute fracture. There is posterior lumbar fusion at L4-5 with an interbody spacer without hardware failure or complication. There is mild degenerative disc disease at L2-3 and L3-4 with mild disc height loss. There is bilateral facet arthropathy at L5-S1. The SI joints are unremarkable. IMPRESSION: 1.  No acute osseous injury of the lumbar spine. 2. Posterior lumbar fusion at L4-5. Electronically Signed   By: Elige Ko   On: 05/24/2016 15:30    Procedures Procedures (including critical care time)  Medications Ordered in ED Medications - No data to display   Initial Impression / Assessment and Plan / ED Course  I have reviewed the triage vital signs and the nursing notes.  Pertinent labs & imaging results that were available during my care of the patient were reviewed by me and considered in my medical decision making (see chart for details).      Patient s/p lumbar fusion within last year.  Lumbar film reveals stable degenerative disease without fracture.  Patient without radicular symptoms or red flags.  No further imaging warranted at this time.  Rx provided for flexeril and voltaren gel.  Return precautions discussed.   Final Clinical Impressions(s) / ED Diagnoses   Final diagnoses:  Acute right-sided low back pain without sciatica  Muscle strain    New Prescriptions New Prescriptions   CYCLOBENZAPRINE (FLEXERIL) 10 MG TABLET    Take 1 tablet (10 mg total) by mouth 3 (three) times daily as needed for muscle spasms.   DICLOFENAC SODIUM (VOLTAREN) 1 % GEL    Apply 2 g topically 4 (four) times daily as needed.    Erasmo DownerAngela M Reiko Vinje, MD, MPH PGY-3,  [facility] 05/24/2016 3:41 PM    Erasmo DownerAngela M  Lynnda Wiersma, MD 05/24/16 1541    Gwyneth SproutWhitney Plunkett, MD 05/24/16 1704

## 2018-08-30 IMAGING — DX DG LUMBAR SPINE COMPLETE 4+V
5 series · 5 of 5 positions shown · non-contrast
Comparison: 08/29/2015

CLINICAL DATA: Sudden onset back pain while working in the yard.
Prior back surgery August 2015.

EXAM:
LUMBAR SPINE - COMPLETE 4+ VIEW

[l-spine ap]
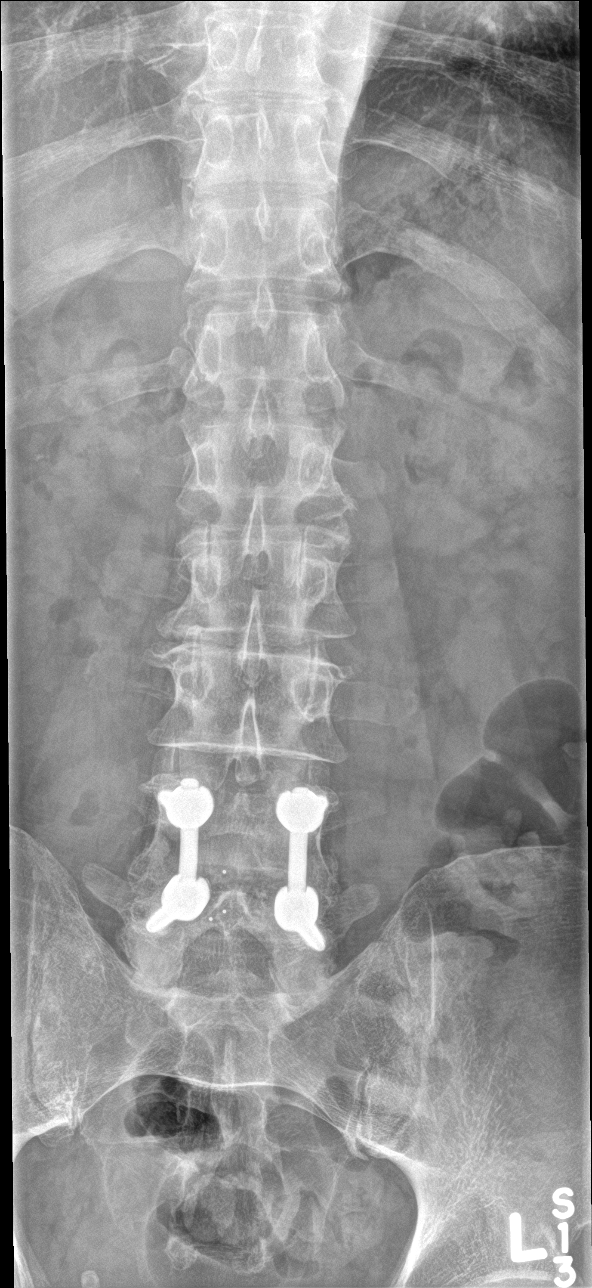

[l-spine obl (1 of 2)]
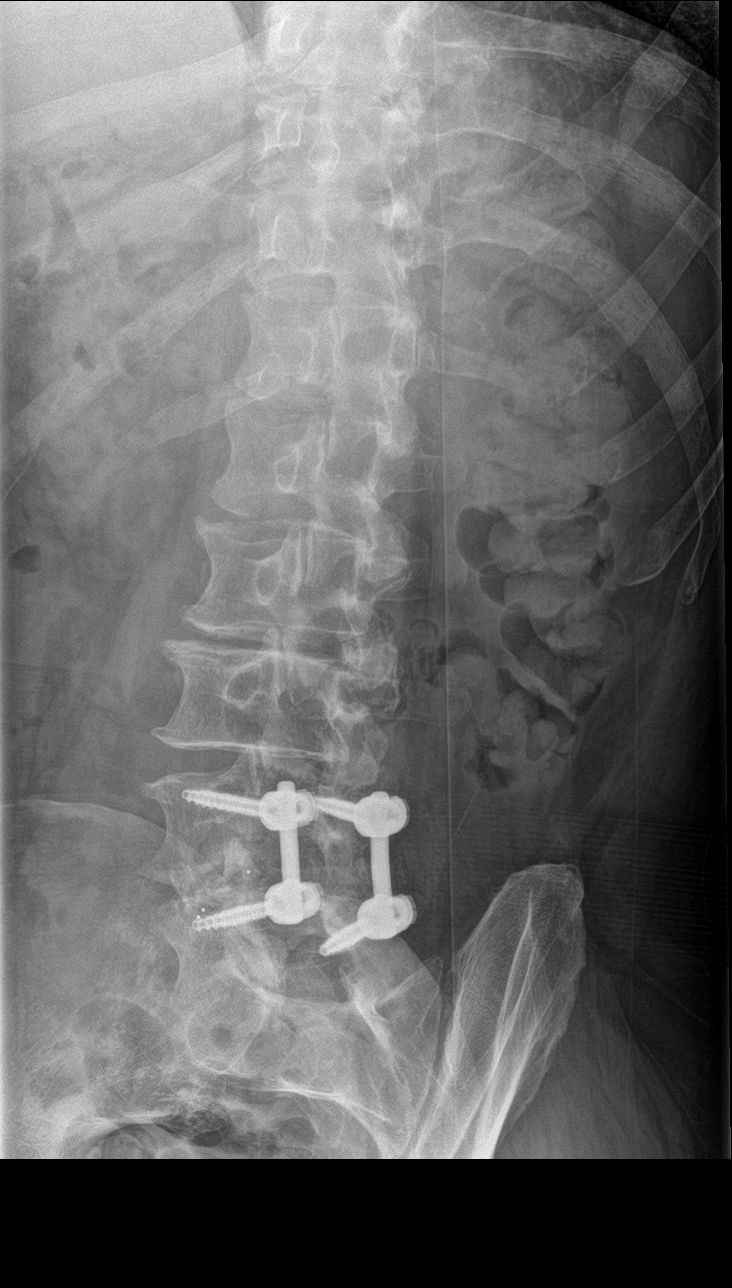

[l-spine obl (2 of 2)]
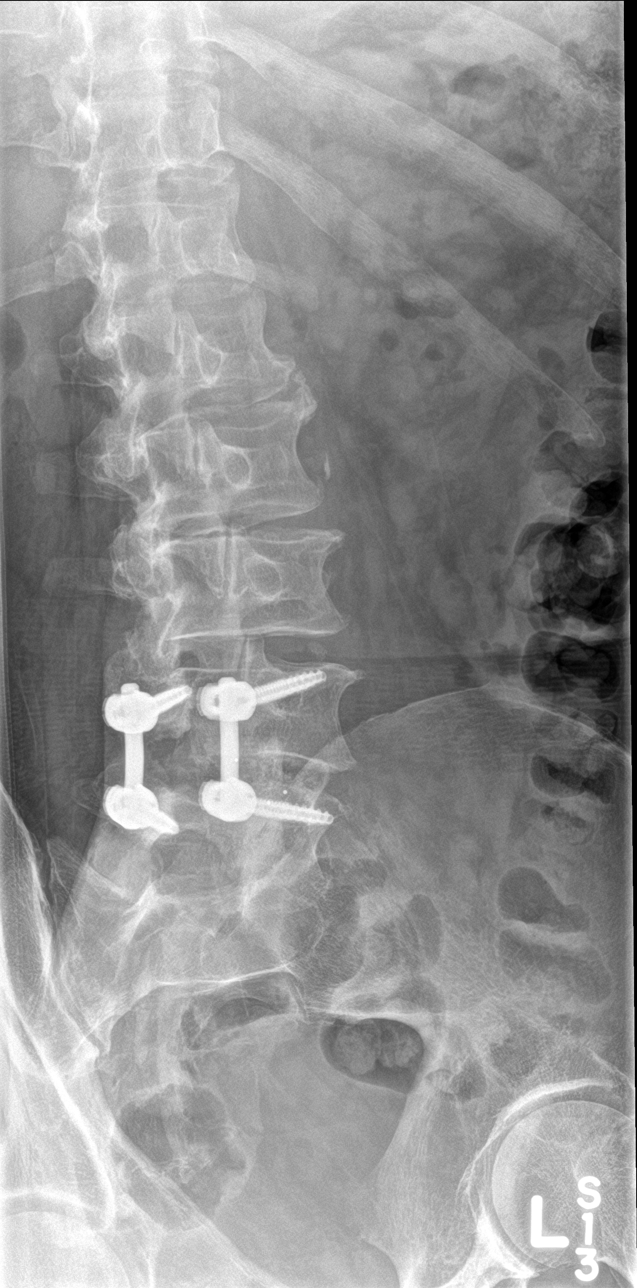

[l-spine lat]
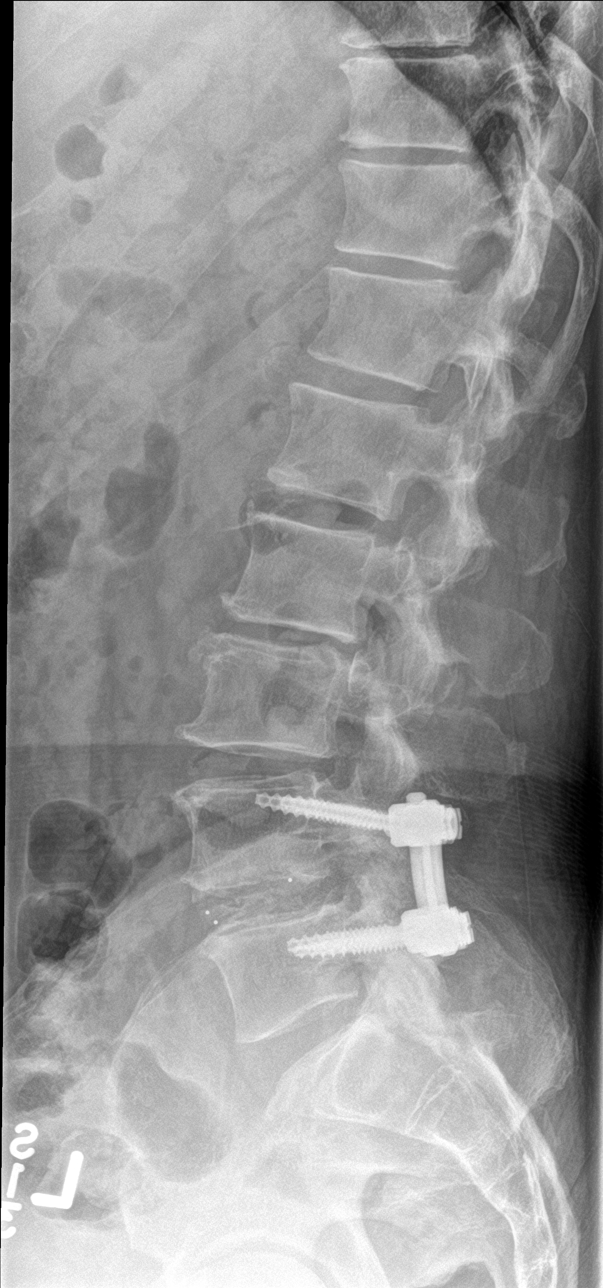

[l-spine spot]
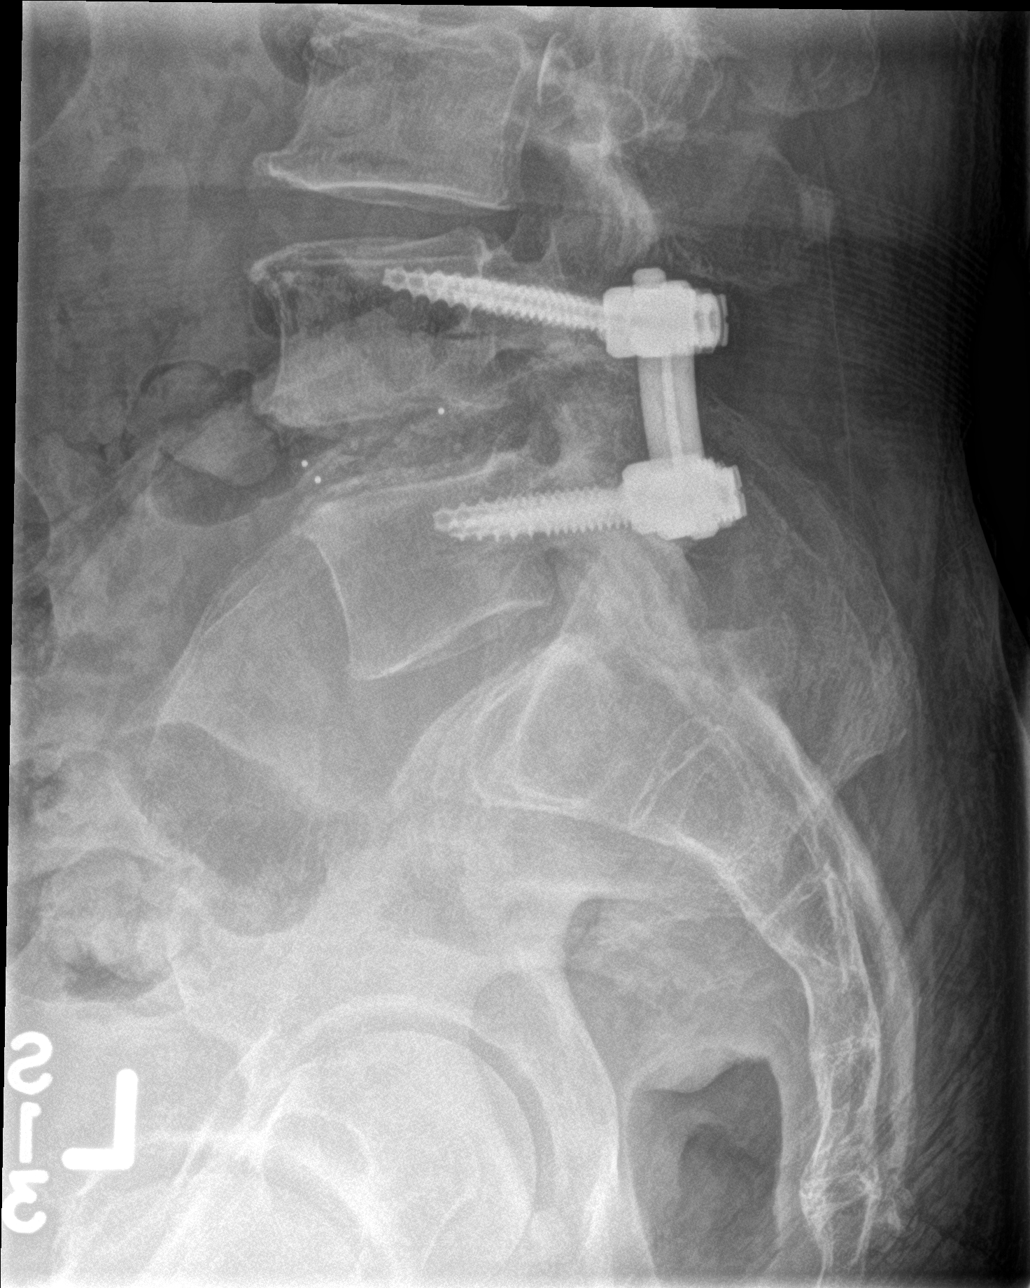

[5 of 5 positions shown; findings below may reference images not displayed]

FINDINGS: There are 5 nonrib bearing lumbar-type vertebral bodies.

There is generalized osteopenia.

The vertebral body heights are maintained.

The alignment is anatomic. There is no static listhesis. There is no
spondylolysis.

There is no acute fracture.

There is posterior lumbar fusion at L4-5 with an interbody spacer
without hardware failure or complication. There is mild degenerative
disc disease at L2-3 and L3-4 with mild disc height loss. There is
bilateral facet arthropathy at L5-S1.

The SI joints are unremarkable.
IMPRESSION: 1.  No acute osseous injury of the lumbar spine.
2. Posterior lumbar fusion at L4-5.
